# Patient Record
Sex: Female | Born: 1975 | Race: Black or African American | Hispanic: No | Marital: Married
Health system: Midwestern US, Community
[De-identification: ages and names within clinical notes are randomized; demographics above are authoritative.]

---

## 2019-10-14 ENCOUNTER — Ambulatory Visit: Attending: Family Medicine

## 2022-01-15 ENCOUNTER — Ambulatory Visit
Admit: 2022-01-15 | Discharge: 2022-01-15 | Payer: BLUE CROSS/BLUE SHIELD | Attending: Internal Medicine | Primary: Internal Medicine

## 2022-01-15 ENCOUNTER — Ambulatory Visit: Attending: Internal Medicine | Primary: Internal Medicine

## 2022-01-15 DIAGNOSIS — Z7689 Persons encountering health services in other specified circumstances: Secondary | ICD-10-CM

## 2022-01-15 MED ORDER — ALBUTEROL SULFATE HFA 90 MCG/ACTUATION AEROSOL INHALER
90 mcg/actuation | RESPIRATORY_TRACT | 2 refills | Status: AC
Start: 2022-01-15 — End: ?

## 2022-01-15 NOTE — Progress Notes (Signed)
Vanessa Flynn is a 46 y.o. female and presents with New Patient (Establishing care ) and Documentation (FMLA paperwork for back and knee pain )    .  Subjective:    New patient. Establish Care  Moved from [place] 3 yrs ago  Pt has been receiving her medical care from the [place]. Her [facility] provider will no longer complete her FMLA form. Pt has OA and sciatica.    PMH- anxiety   Asthma   DJD   Sciatica   PCOS    PSH- laprascopic procedure    S/p salpingectomy due to ectopic pregnancy    SH- single   Pt lives w sig other    No tob, rare alcohol, no illicit drug use    FH- mother died intraabdominal infection, CKD/COPD   Father alive HTN, prostate ca   6 siblings 1 sister/brother HTN    HM  Mammo 2022, scheduled at [facility] for this year  Colonoscopy not yet  Immunizations  Eye care  Dental care  Pap UTD        Review of Systems  Constitutional: negative for fevers, chills, anorexia and weight loss  Eyes:   negative for visual disturbance and irritation  ENT:   negative for tinnitus,sore throat,nasal congestion,ear pains.hoarseness  Respiratory:  negative for cough, hemoptysis, dyspnea,wheezing  CV:   negative for chest pain, palpitations, lower extremity edema  GI:   negative for nausea, vomiting, diarrhea, abdominal pain,melena  Endo:               negative for polyuria,polydipsia,polyphagia,heat intolerance  Genitourinary: negative for frequency, dysuria and hematuria  Integument:  negative for rash and pruritus  Hematologic:  negative for easy bruising and gum/nose bleeding  Musculoskel: negative for myalgias, arthralgias, back pain, muscle weakness, joint pain  Neurological:  negative for headaches, dizziness, vertigo, memory problems and gait   Behavl/Psych: negative for feelings of anxiety, depression, mood changes    History reviewed. No pertinent past medical history.  History reviewed. No pertinent surgical history.  Social History     Socioeconomic History    Marital status: MARRIED   Tobacco Use    Smoking status:  Never     Passive exposure: Never    Smokeless tobacco: Never   Vaping Use    Vaping Use: Never used   Substance and Sexual Activity    Alcohol use: Never    Drug use: Never    Sexual activity: Yes     Partners: Male     Social Determinants of Health     Financial Resource Strain: Low Risk     Difficulty of Paying Living Expenses: Not hard at all   Food Insecurity: No Food Insecurity    Worried About Programme researcher, broadcasting/film/video in the Last Year: Never true    Ran Out of Food in the Last Year: Never true   Transportation Needs: No Transportation Needs    Lack of Transportation (Medical): No    Lack of Transportation (Non-Medical): No   Physical Activity: Insufficiently Active    Days of Exercise per Week: 1 day    Minutes of Exercise per Session: 20 min   Stress: No Stress Concern Present    Feeling of Stress : Not at all   Social Connections: Moderately Integrated    Frequency of Communication with Friends and Family: More than three times a week    Frequency of Social Gatherings with Friends and Family: Once a week    Attends Religious Services: Never  Active Member of Clubs or Organizations: Yes    Attends BankerClub or Organization Meetings: Never    Marital Status: Living with partner   Housing Stability: Low Risk     Unable to Pay for Housing in the Last Year: No    Number of Places Lived in the Last Year: 1    Unstable Housing in the Last Year: No     History reviewed. No pertinent family history.    Allergies   Allergen Reactions    Onion Anaphylaxis       Objective:  Visit Vitals  BP 126/82 (BP 1 Location: Right upper arm, BP Patient Position: Sitting, BP Cuff Size: Large adult long)   Pulse 77   Temp 96.9 F (36.1 C) (Temporal)   Resp 15   Ht 5\' 5"  (1.651 m)   Wt 307 lb (139.3 kg)   LMP 01/11/2022 (Exact Date)   SpO2 97%   BMI 51.09 kg/m     Physical Exam:   General appearance - alert, obese, and in no distress  Mental status - alert, oriented to person, place, and time  EYE-EOMI  Neck - supple, no significant  adenopathy   Chest - clear to auscultation, no wheezes, rales or rhonchi, symmetric air entry   Heart - normal rate, regular rhythm, normal S1, S2  Abdomen - obese  Ext-peripheral pulses normal, no pedal edema, no clubbing or cyanosis  Skin-Warm and dry. + facial hyperpigmentation   Neuro -alert, oriented, normal speech, no focal findings or movement disorder noted      No results found for this or any previous visit.    Assessment/Plan:    ICD-10-CM ICD-9-CM    1. Establishing care with new doctor, encounter for  Z76.89 V65.8         Orders Placed This Encounter    DISCONTD: albuterol (PROVENTIL HFA, VENTOLIN HFA, PROAIR HFA) 90 mcg/actuation inhaler     Sig: INHALE 2 PUFFS BY MOUTH EVERY 4 HOURS AS NEEDED    budesonide-formoteroL (SYMBICORT) 80-4.5 mcg/actuation HFAA     Sig: Take 2 Puffs by inhalation.    cyclobenzaprine (FLEXERIL) 10 mg tablet     Sig: Take 1 Tablet by mouth three (3) times daily as needed.    diclofenac (VOLTAREN) 1 % gel     Sig: Apply  to affected area.    Flovent HFA 44 mcg/actuation inhaler     Sig: 2 PUFFS INHALED EVERY 12 HOURS    hydrOXYzine HCL (ATARAX) 25 mg tablet     Sig: Take  by mouth.    ibuprofen (MOTRIN) 800 mg tablet     Sig: TAKE 1 TABLET BY MOUTH THREE TIMES A DAY AS NEEDED FOR PAIN    lidocaine-transparent dressing (LMX 4 PLUS) 4 % dressing     Sig: Apply  to affected area.    terbinafine HCL (LAMISIL) 250 mg tablet    naproxen (NAPROSYN) 500 mg tablet     Sig: Take 1 Tablet by mouth two (2) times daily (with meals).    albuterol (PROVENTIL HFA, VENTOLIN HFA, PROAIR HFA) 90 mcg/actuation inhaler     Sig: INHALE 2 PUFFS BY MOUTH EVERY 4 HOURS AS NEEDED     Dispense:  18 g     Refill:  2       1. Establishing care with new doctor, encounter for  Completed    2. Generalized OA  Pt will upload supporting documents and her form will be completed posthaste    3.  Lumbar radiculopathy  As above    4. Morbid obesity (HCC)  Noted    5. Infertility, female  Noted    There are no  Patient Instructions on file for this visit.   Follow-up and Dispositions    Return in about 6 months (around 07/17/2022) for routine.           I have reviewed with the patient details of the assessment and plan and all questions were answered. Relevent patient education was performed.The most recent lab findings were reviewed with the patient.    An After Visit Summary was printed and given to the patient.      Barbarann Ehlers MD

## 2022-01-15 NOTE — Telephone Encounter (Signed)
Telephone Encounter by Daralene Milch., LPN at [birth year] [phone]                Author: Daralene Milch., LPN  Service: --  Author Type: Licensed Nurse       Filed: 01/15/22 0928  Encounter Date: 01/15/2022  Status: Signed          Editor: Bosher, Barbaraann Barthel., LPN (Licensed Nurse)          From: Mick Sell McWilliamsTo: Barbarann Ehlers, MDSent: 01/15/2022  9:27 AM EDTSubject: FMLA PROOFHere are the records from my Compensation exam  for the [facility].

## 2022-01-15 NOTE — Progress Notes (Signed)
Pt is here for   Chief Complaint   Patient presents with    New Patient     Establishing care      1. "Have you been to the ER, urgent care clinic since your last visit?  Hospitalized since your last visit?" No    2. "Have you seen or consulted any other health care providers outside of the [facility] since your last visit?" No     3. For patients aged 45-75: Has the patient had a colonoscopy / FIT/ Cologuard? No      If the patient is female:    4. For patients aged 40-74: Has the patient had a mammogram within the past 2 years? Yes - Care Gap present. Most recent result on file      5. For patients aged 21-65: Has the patient had a pap smear? No

## 2022-03-21 MED ORDER — ALBUTEROL SULFATE HFA 108 (90 BASE) MCG/ACT IN AERS
108 (90 Base) MCG/ACT | RESPIRATORY_TRACT | 2 refills | Status: AC
Start: 2022-03-21 — End: ?

## 2022-07-16 ENCOUNTER — Encounter: Attending: Internal Medicine | Primary: Internal Medicine

## 2023-10-13 IMAGING — MR MRI LUMBAR SPINE WITHOUT CONTRAST
1 series · 1 of 1 positions shown · non-contrast
Comparison: none

﻿MRI OF THE LUMBAR SPINE:
HISTORY: Slip and fall dated 09/27/2023 with low back pain.
TECHNIQUE: Multisequence T1 and T2 weighted images were obtained.

[Series 5: s-c scano · coronal · 6.0mm · 1.17mm/px · 1 of 1 slices shown]
[im 1/1]
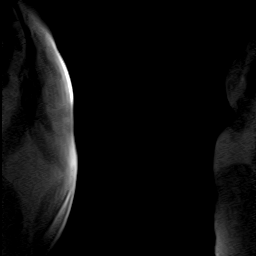

[1 of 1 positions shown; findings below may reference images not displayed]

FINDINGS: The conus medullaris appears normal.  The lordotic curvature of the lumbar spine is preserved.  No evidence for abnormal solid lesions is identified.  No prevertebral or paravertebral masses or fluid collections are seen and there is no evidence for abnormal marrow replacing lesion.  

There is a Tarlov cyst at the level of S3.  

Segmental analysis of the lumbar spine is as follows:

At L1-2, there is no evidence for disc herniation, canal stenosis or neural foraminal stenosis.

At L2-3, there is no evidence for disc herniation, canal stenosis or neural foraminal stenosis.

At L3-4, there is no evidence for disc herniation, canal stenosis or neural foraminal stenosis.  There is disc desiccation.  

At L4-5, there is no evidence for disc herniation, canal stenosis or neural foraminal stenosis.  There is disc desiccation.  

At L5-S1, there is a disc bulge, desiccation, osteophytes, and facet hypertrophy.  There is anterior impression on the thecal sac.  There is mild bilateral neural foraminal stenosis.  The spinal canal is patent.
IMPRESSION: 1. There is a Tarlov cyst at the level of S3.  

2. At L3-4, there is disc desiccation.  

3. At L4-5, there is disc desiccation.  

4. At L5-S1, there is a disc bulge, desiccation, osteophytes, and facet hypertrophy.  There is anterior impression on the thecal sac.  There is mild bilateral neural foraminal stenosis.

## 2023-10-13 IMAGING — MR MRI KNEE LT W/O CONTRAST
1 series · 1 of 1 positions shown · non-contrast
Comparison: none

﻿MRI OF THE LEFT KNEE:
HISTORY: Slip and fall dated 09/27/2023 with left knee pain.
TECHNIQUE: Multisequence T1 and T2 weighted images were obtained.

[Series 1: scano s/t/c · sagittal · left · 10.0mm · 0.94mm/px · 1 of 1 slices shown]
[im 1/1]
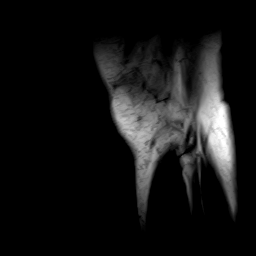

[1 of 1 positions shown; findings below may reference images not displayed]

FINDINGS: LIGAMENTS:  The anterior and posterior cruciate ligaments are intact.  The medial and lateral collateral ligaments are intact.  

MENISCI:  The menisci are normally positioned.  No definite evidence for tearing of the menisci is seen.  

OSSEOUS STRUCTURES AND SOFT TISSUES:  There is a joint effusion present. There is no Baker’s cyst. The quadriceps and patellar tendons are intact. There is mild osteoarthritis involving the knee with bone spurs. The bone marrow signal intensity is normal. The alignment is anatomic.
IMPRESSION: [IP_ADDRESS]. Joint effusion.

[IP_ADDRESS]. Mild osteoarthritis involving the knee with bone spurs.

## 2023-10-13 IMAGING — MR MRI KNEE RT W/O CONTRAST
1 series · 1 of 1 positions shown · non-contrast
Comparison: none

﻿MRI OF THE RIGHT KNEE:
HISTORY: Patient with history of a fall dated 09/27/2023 with right knee pain.
TECHNIQUE: Multisequence T1 and T2 weighted images were obtained.

[Series 1: scano s/t/c · sagittal · right · 10.0mm · 0.94mm/px · 1 of 1 slices shown]
[im 1/1]
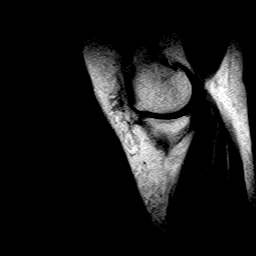

[1 of 1 positions shown; findings below may reference images not displayed]

FINDINGS: LIGAMENTS:  The anterior and posterior cruciate ligaments are intact.  The medial and lateral collateral ligaments are intact.  

MENISCI:  The menisci are normally positioned.  No definite evidence for tearing of the menisci is seen.  

OSSEOUS STRUCTURES AND SOFT TISSUES: There is a joint effusion present. There is no evidence of a Baker's cyst. The quadriceps and patellar tendons are normal. There is osteoarthritis involving the knee with joint space narrowing and bone spurs throughout. There is a bone cyst in the proximal tibia. The bone marrow signal intensity is normal. The alignment is anatomic.
IMPRESSION: 1. Osteoarthritis involving the knee with joint space narrowing and bone spurs throughout.

2. Joint effusion.

3. Bone cyst in the proximal tibia.
